# Patient Record
Sex: Female | Born: 1960 | Race: White | Hispanic: No | Marital: Married | Smoking: Never smoker
Health system: Southern US, Community
[De-identification: ages and names within clinical notes are randomized; demographics above are authoritative.]

## PROBLEM LIST (undated history)

## (undated) DIAGNOSIS — E7800 Pure hypercholesterolemia, unspecified: Secondary | ICD-10-CM

## (undated) DIAGNOSIS — I82409 Acute embolism and thrombosis of unspecified deep veins of unspecified lower extremity: Secondary | ICD-10-CM

## (undated) DIAGNOSIS — I10 Essential (primary) hypertension: Secondary | ICD-10-CM

## (undated) DIAGNOSIS — M47817 Spondylosis without myelopathy or radiculopathy, lumbosacral region: Secondary | ICD-10-CM

## (undated) DIAGNOSIS — E119 Type 2 diabetes mellitus without complications: Secondary | ICD-10-CM

## (undated) DIAGNOSIS — K5190 Ulcerative colitis, unspecified, without complications: Secondary | ICD-10-CM

## (undated) DIAGNOSIS — C679 Malignant neoplasm of bladder, unspecified: Secondary | ICD-10-CM

## (undated) DIAGNOSIS — N8110 Cystocele, unspecified: Secondary | ICD-10-CM

## (undated) DIAGNOSIS — I2699 Other pulmonary embolism without acute cor pulmonale: Secondary | ICD-10-CM

## (undated) HISTORY — DX: Malignant neoplasm of bladder, unspecified: C67.9

## (undated) HISTORY — DX: Essential (primary) hypertension: I10

## (undated) HISTORY — PX: BREAST LUMPECTOMY: SHX2

## (undated) HISTORY — DX: Cystocele, unspecified: N81.10

## (undated) HISTORY — PX: TRANSURETHRAL RESECTION OF BLADDER TUMOR WITH GYRUS (TURBT-GYRUS): SHX6458

## (undated) HISTORY — DX: Other pulmonary embolism without acute cor pulmonale: I26.99

## (undated) HISTORY — DX: Type 2 diabetes mellitus without complications: E11.9

## (undated) HISTORY — PX: CYSTECTOMY W/ URETEROILEAL CONDUIT: SUR361

## (undated) HISTORY — PX: BREAST BIOPSY: SHX20

## (undated) HISTORY — PX: CHOLECYSTECTOMY: SHX55

## (undated) HISTORY — DX: Spondylosis without myelopathy or radiculopathy, lumbosacral region: M47.817

## (undated) HISTORY — DX: Acute embolism and thrombosis of unspecified deep veins of unspecified lower extremity: I82.409

## (undated) HISTORY — DX: Ulcerative colitis, unspecified, without complications: K51.90

## (undated) HISTORY — DX: Pure hypercholesterolemia, unspecified: E78.00

---

## 2002-09-24 ENCOUNTER — Ambulatory Visit (HOSPITAL_COMMUNITY): Admission: RE | Admit: 2002-09-24 | Discharge: 2002-09-24 | Payer: Self-pay | Admitting: Radiology

## 2002-10-04 ENCOUNTER — Encounter: Admission: RE | Admit: 2002-10-04 | Discharge: 2002-10-04 | Payer: Self-pay | Admitting: Radiology

## 2003-04-04 ENCOUNTER — Encounter: Admission: RE | Admit: 2003-04-04 | Discharge: 2003-04-04 | Payer: Self-pay | Admitting: Radiology

## 2020-08-29 ENCOUNTER — Other Ambulatory Visit: Payer: Self-pay | Admitting: Hematology and Oncology

## 2020-08-29 DIAGNOSIS — Z86718 Personal history of other venous thrombosis and embolism: Secondary | ICD-10-CM

## 2020-09-02 ENCOUNTER — Other Ambulatory Visit: Payer: Self-pay

## 2020-09-02 ENCOUNTER — Ambulatory Visit
Admission: RE | Admit: 2020-09-02 | Discharge: 2020-09-02 | Disposition: A | Discharge status: Home / Self Care | Payer: BC Managed Care – PPO | Source: Ambulatory Visit | Attending: Hematology and Oncology | Admitting: Hematology and Oncology

## 2020-09-02 ENCOUNTER — Encounter: Payer: Self-pay | Admitting: *Deleted

## 2020-09-02 DIAGNOSIS — Z86718 Personal history of other venous thrombosis and embolism: Secondary | ICD-10-CM

## 2020-09-02 HISTORY — PX: IR RADIOLOGIST EVAL & MGMT: IMG5224

## 2020-09-02 NOTE — Consult Note (Signed)
Chief Complaint:  Preoperative IVC filter assessment and placement  Referring Physician(s): Huff,Jason D.  History of Present Illness: Lauren Graves is a 59 y.o. 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  She is to undergo complete restaging evaluation with cystoscopy, bladder biopsy/TURBT at [facility] with Dr. Nevada Crane.  On her recent staging CT imaging, acute pulmonary emboli were demonstrated.  She also had CT and ultrasound imaging confirming left lower extremity acute DVT.  For anticoagulation she was placed on Eliquis.  For the surgical procedure she is to discontinue the Eliquis on on 09/03/2020 7 days prior to the scheduled surgical procedure at [facility] next Wednesday, 09/10/2020.  While she is off the Eliquis, she will need placement of a retrievable IVC filter for the perioperative phase and recovery phase.  This will be a retrievable filter.  Today's discussion is regarding the filter placement.  This is a telephone health visit today because of Covid pandemic and to expedite the procedure.  No past medical history on file.    Allergies: Patient has no allergy information on record.  Medications: Prior to Admission medications   Not on File     No family history on file.  Social History   Socioeconomic History  . Marital status: Married    Spouse name: Not on file  . Number of children: Not on file  . Years of education: Not on file  . Highest education level: Not on file  Occupational History  . Not on file  Tobacco Use  . Smoking status: Not on file  Substance and Sexual Activity  . Alcohol use: Not on file  . Drug use: Not on file  . Sexual activity: Not on file  Other Topics Concern  . Not on file  Social History Narrative  . Not on file   Social Determinants of Health   Financial Resource Strain:   . Difficulty of Paying Living Expenses: Not on file  Food Insecurity:   . Worried About Paediatric nurse in the Last Year: Not on file  . Ran Out of Food in the Last Year: Not on file  Transportation Needs:   . Lack of Transportation (Medical): Not on file  . Lack of Transportation (Non-Medical): Not on file  Physical Activity:   . Days of Exercise per Week: Not on file  . Minutes of Exercise per Session: Not on file  Stress:   . Feeling of Stress : Not on file  Social Connections:   . Frequency of Communication with Friends and Family: Not on file  . Frequency of Social Gatherings with Friends and Family: Not on file  . Attends Religious Services: Not on file  . Active Member of Clubs or Organizations: Not on file  . Attends Archivist Meetings: Not on file  . Marital Status: Not on file     Review of Systems  Review of Systems: A 12 point ROS discussed and pertinent positives are indicated in the HPI above.  All other systems are negative.  Physical Exam No direct physical exam was performed, telephone health visit only today because of Covid pandemic Vital Signs: There were no vitals taken for this visit.  Imaging: No results found.  Labs:  CBC: No results for input(s): WBC, HGB, HCT, PLT in the last 8760 hours.  COAGS: No results for input(s): INR, APTT in the last 8760 hours.  BMP: No results for  input(s): NA, K, CL, CO2, GLUCOSE, BUN, CALCIUM, CREATININE, GFRNONAA, GFRAA in the last 8760 hours.  Invalid input(s): CMP  LIVER FUNCTION TESTS: No results for input(s): BILITOT, AST, ALT, ALKPHOS, PROT, ALBUMIN in the last 8760 hours.  TUMOR MARKERS: No results for input(s): AFPTM, CEA, CA199, CHROMGRNA in the last 8760 hours.  Assessment and Plan:  Clinical stage III urothelial cancer of the bladder.  She is scheduled for complete restaging evaluation with cystoscopy, bladder biopsy/TURBT with Dr. Nevada Crane at [facility].  She will need a preoperative filter given the history of recent acute DVT/PE.  She is already been instructed to stop  her Eliquis 7 days prior to the surgery.  Our discussion today centered around placement of a retrievable Bard Denali IVC filter.  The procedure, risk, benefits and alternatives were reviewed.  The risk of IVC filter placement were reviewed, including malposition, filter tilt, filter migration, and filter thrombosis.  She understands the procedure and all questions were addressed.  She would like to schedule the procedure prior to her surgery next Wednesday.  Plan: Scheduled for outpatient IVC filter insertion at [facility] preferably next Tuesday, 09/09/2020 as I am scheduled at [facility] that day.  Surgery is scheduled the following day on Wednesday, 09/10/2020.  Thank you for this interesting consult.  I greatly enjoyed meeting JLEIGH STRIPLIN and look forward to participating in their care.  A copy of this report was sent to the requesting provider on this date.  Electronically Signed: Greggory Keen 09/02/2020, 11:06 AM   I spent a total of  40 Minutes   in remote  clinical consultation, greater than 50% of which was counseling/coordinating care for this patient with acute DVT/PE.    Visit type: Audio only (telephone). Audio (no video) only due to patient's lack of internet/smartphone capability. Alternative for in-person consultation at [facility], [address] [place], [place], [place]. This visit type was conducted due to national recommendations for restrictions regarding the COVID-19 Pandemic (e.g. social distancing).  This format is felt to be most appropriate for this patient at this time.  All issues noted in this document were discussed and addressed.

## 2020-11-11 ENCOUNTER — Other Ambulatory Visit: Payer: Self-pay | Admitting: Interventional Radiology

## 2020-11-11 DIAGNOSIS — Z86718 Personal history of other venous thrombosis and embolism: Secondary | ICD-10-CM

## 2020-12-03 ENCOUNTER — Ambulatory Visit
Admission: RE | Admit: 2020-12-03 | Discharge: 2020-12-03 | Disposition: A | Discharge status: Home / Self Care | Payer: BC Managed Care – PPO | Source: Ambulatory Visit | Attending: Interventional Radiology | Admitting: Interventional Radiology

## 2020-12-03 ENCOUNTER — Encounter: Payer: Self-pay | Admitting: *Deleted

## 2020-12-03 DIAGNOSIS — Z86718 Personal history of other venous thrombosis and embolism: Secondary | ICD-10-CM

## 2020-12-03 HISTORY — PX: IR RADIOLOGIST EVAL & MGMT: IMG5224

## 2020-12-03 NOTE — Progress Notes (Signed)
Patient ID: Lauren Graves, female   DOB: [birth year], 60 y.o.   MRN: [id number]       Chief Complaint:  IVC filter management  Referring Physician(s): Dr. Harlow Asa  History of Present Illness: Lauren Graves is a 60 y.o. female with a prior history of bladder cancer status post cystectomy with ileal conduit diversion in late October.   Patient a prior history of PE/DVT and had a perioperative IVC filter placed temporarily.    She is recovering very well as an outpatient.  She is not quite back to her functional baseline but fairly active.  She is not back to work yet.  Overall she is doing very well.  No recent illness or fevers.  No chest pain or shortness of breath.  She remains on Eliquis for the previous DVT and PE event.  She has been on this for 3 months with plans for a total of 6 months of treatment.  Repeat will extremity ultrasound today demonstrates no evidence of DVT in either extremity.  No past medical history on file.    Allergies: Patient has no allergy information on record.  Medications: Prior to Admission medications   Not on File     No family history on file.  Social History   Socioeconomic History  . Marital status: Married    Spouse name: Not on file  . Number of children: Not on file  . Years of education: Not on file  . Highest education level: Not on file  Occupational History  . Not on file  Tobacco Use  . Smoking status: Not on file  . Smokeless tobacco: Not on file  Substance and Sexual Activity  . Alcohol use: Not on file  . Drug use: Not on file  . Sexual activity: Not on file  Other Topics Concern  . Not on file  Social History Narrative  . Not on file   Social Determinants of Health   Financial Resource Strain: Not on file  Food Insecurity: Not on file  Transportation Needs: Not on file  Physical Activity: Not on file  Stress: Not on file  Social Connections: Not on file    ECOG Status: 2 - Symptomatic, <50% confined to  bed  Review of Systems: A 12 point ROS discussed and pertinent positives are indicated in the HPI above.  All other systems are negative.  Review of Systems  Vital Signs: There were no vitals taken for this visit.  Physical Exam Constitutional:      General: She is not in acute distress.    Appearance: She is not toxic-appearing.  Eyes:     General: No scleral icterus.    Conjunctiva/sclera: Conjunctivae normal.  Cardiovascular:     Rate and Rhythm: Normal rate and regular rhythm.     Heart sounds: Normal heart sounds. No murmur heard.   Pulmonary:     Effort: Pulmonary effort is normal.     Breath sounds: Normal breath sounds.  Abdominal:     General: Bowel sounds are normal. There is no distension.     Comments: Urostomy site in the abdomen noted.  Postop changes of the abdominal wall.  Musculoskeletal:        General: No swelling or tenderness.  Skin:    Coloration: Skin is not jaundiced.  Neurological:     General: No focal deficit present.     Mental Status: Mental status is at baseline.  Psychiatric:        Mood and  Affect: Mood normal.       Imaging: US Venous Img Lower Bilateral (DVT)  Result Date: 12/03/2020 CLINICAL DATA:  Preoperative IVC filter in October 2021. History of DVT. Assess for residual DVT prior to filter removal. EXAM: BILATERAL LOWER EXTREMITY VENOUS DOPPLER ULTRASOUND TECHNIQUE: Gray-scale sonography with graded compression, as well as color Doppler and duplex ultrasound were performed to evaluate the lower extremity deep venous systems from the level of the common femoral vein and including the common femoral, femoral, profunda femoral, popliteal and calf veins including the posterior tibial, peroneal and gastrocnemius veins when visible. The superficial great saphenous vein was also interrogated. Spectral Doppler was utilized to evaluate flow at rest and with distal augmentation maneuvers in the common femoral, femoral and popliteal veins.  COMPARISON:  None. FINDINGS: RIGHT LOWER EXTREMITY Common Femoral Vein: No evidence of thrombus. Normal compressibility, respiratory phasicity and response to augmentation. Saphenofemoral Junction: No evidence of thrombus. Normal compressibility and flow on color Doppler imaging. Profunda Femoral Vein: No evidence of thrombus. Normal compressibility and flow on color Doppler imaging. Femoral Vein: No evidence of thrombus. Normal compressibility, respiratory phasicity and response to augmentation. Popliteal Vein: No evidence of thrombus. Normal compressibility, respiratory phasicity and response to augmentation. Calf Veins: No evidence of thrombus. Normal compressibility and flow on color Doppler imaging. Superficial Great Saphenous Vein: No evidence of thrombus. Normal compressibility. LEFT LOWER EXTREMITY Common Femoral Vein: No evidence of thrombus. Normal compressibility, respiratory phasicity and response to augmentation. Saphenofemoral Junction: No evidence of thrombus. Normal compressibility and flow on color Doppler imaging. Profunda Femoral Vein: No evidence of thrombus. Normal compressibility and flow on color Doppler imaging. Femoral Vein: No evidence of thrombus. Normal compressibility, respiratory phasicity and response to augmentation. Popliteal Vein: No evidence of thrombus. Normal compressibility, respiratory phasicity and response to augmentation. Calf Veins: No evidence of thrombus. Normal compressibility and flow on color Doppler imaging. Superficial Great Saphenous Vein: No evidence of thrombus. Normal compressibility. IMPRESSION: No evidence of deep venous thrombosis in either lower extremity. Electronically Signed   By: Jerilynn Mages.  Adeana Grilliot M.D.   On: 12/03/2020 11:56   IR Radiologist Eval & Mgmt  Result Date: 12/03/2020 Please refer to notes tab for details about interventional procedure. (Op Note)   Labs:  CBC: No results for input(s): WBC, HGB, HCT, PLT in the last 8760 hours.  COAGS: No  results for input(s): INR, APTT in the last 8760 hours.  BMP: No results for input(s): NA, K, CL, CO2, GLUCOSE, BUN, CALCIUM, CREATININE, GFRNONAA, GFRAA in the last 8760 hours.  Invalid input(s): CMP  LIVER FUNCTION TESTS: No results for input(s): BILITOT, AST, ALT, ALKPHOS, PROT, ALBUMIN in the last 8760 hours.  TUMOR MARKERS: No results for input(s): AFPTM, CEA, CA199, CHROMGRNA in the last 8760 hours.  Assessment and Plan:  History of previous acute DVT and PE.  Patient had a IVC filter placed perioperatively for bladder surgery which was performed in late October.  She is recovering very well as an outpatient.  She is closely followed by urology and oncology in [facility].  Overall she is doing very well at this point.  Repeat ultrasound the lower extremities demonstrates no evidence of DVT.  At this point, she can have the filter removed.  This was reviewed with the patient.  All questions addressed.  Plan: She is scheduled to see Dr. Harlow Asa in early March for her oncology follow-up.  As long as he is in agreement at that time, we plan to electively remove the filter  sometime in March or April.  This will be performed in [facility].  Thank you for this interesting consult.  I greatly enjoyed meeting DASHONNA ZAMBELLI and look forward to participating in their care.  A copy of this report was sent to the requesting provider on this date.  Electronically Signed: Greggory Keen 12/03/2020, 12:04 PM   I spent a total of    25 Minutes in face to face in clinical consultation, greater than 50% of which was counseling/coordinating care for this patient with an IVC retrievable filter.

## 2021-02-03 ENCOUNTER — Other Ambulatory Visit: Payer: Self-pay | Admitting: Hematology and Oncology

## 2021-02-03 DIAGNOSIS — Z95828 Presence of other vascular implants and grafts: Secondary | ICD-10-CM

## 2021-08-24 IMAGING — US US EXTREM LOW VENOUS
1 series · 13 of 24 positions shown · non-contrast
Comparison: None.

CLINICAL DATA: Preoperative IVC filter in August 2020. History of
DVT. Assess for residual DVT prior to filter removal.



[Series 1: us extrem low venous · 0.07mm/px · 13 of 63 slices shown]
[im 1/63]
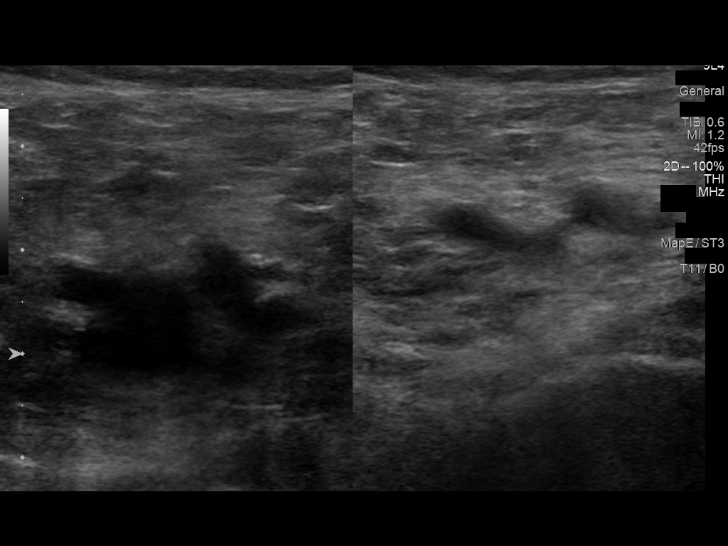
[im 6/63]
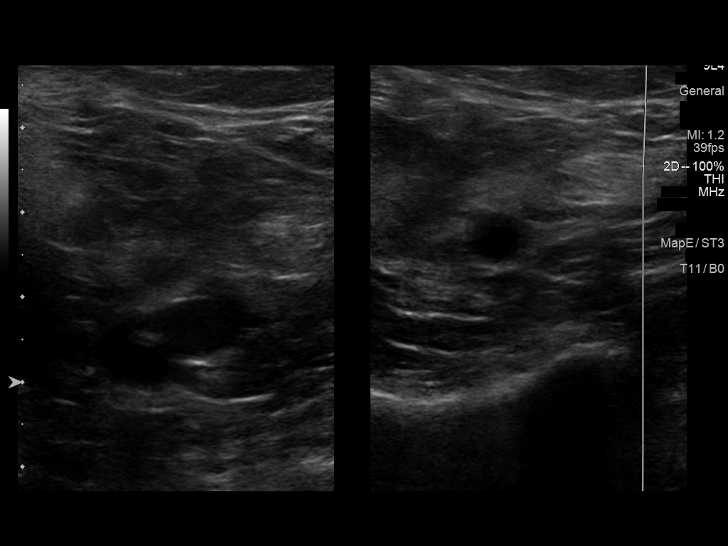
[im 11/63]
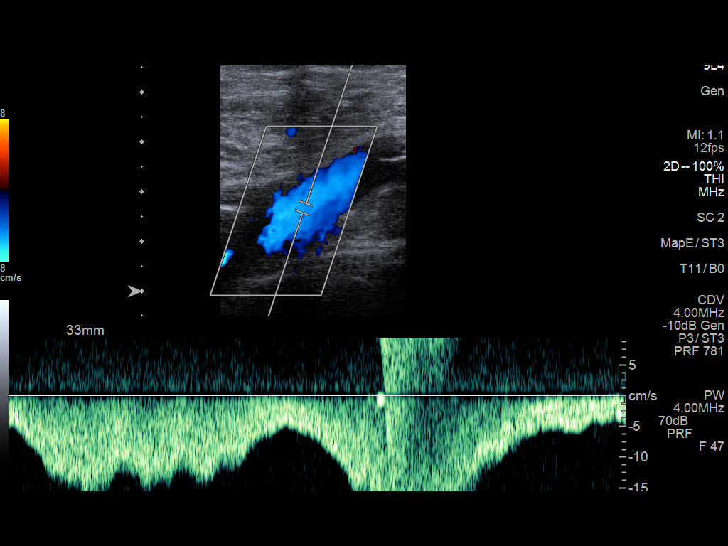
[im 17/63]
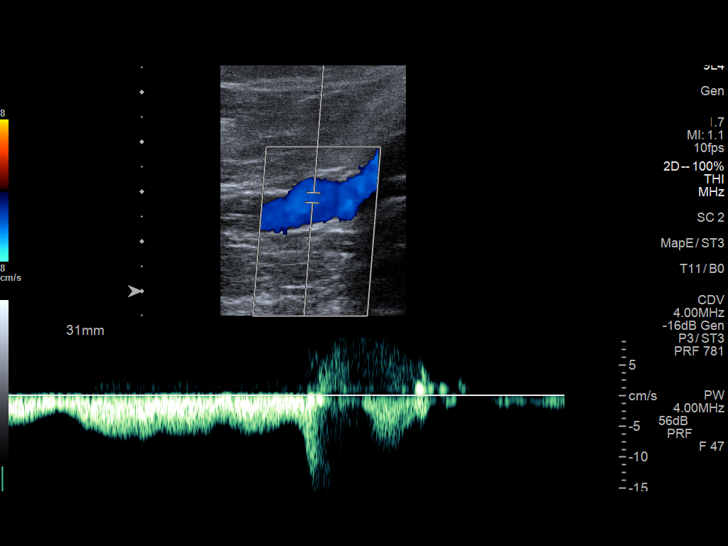
[im 22/63]
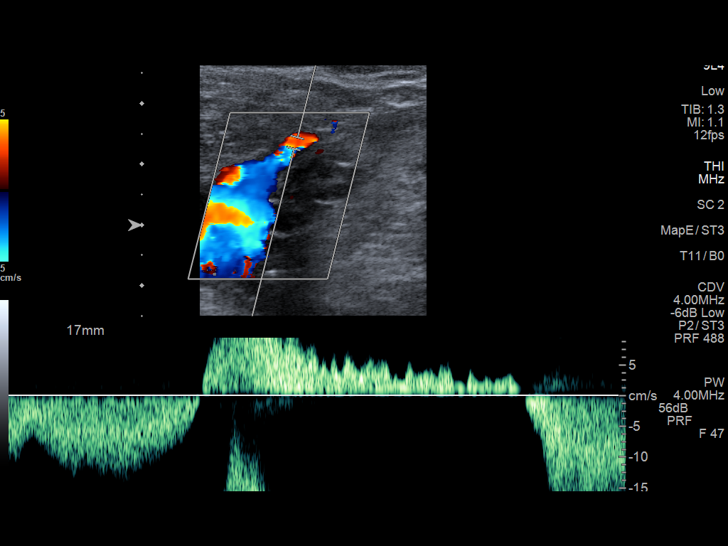
[im 27/63]
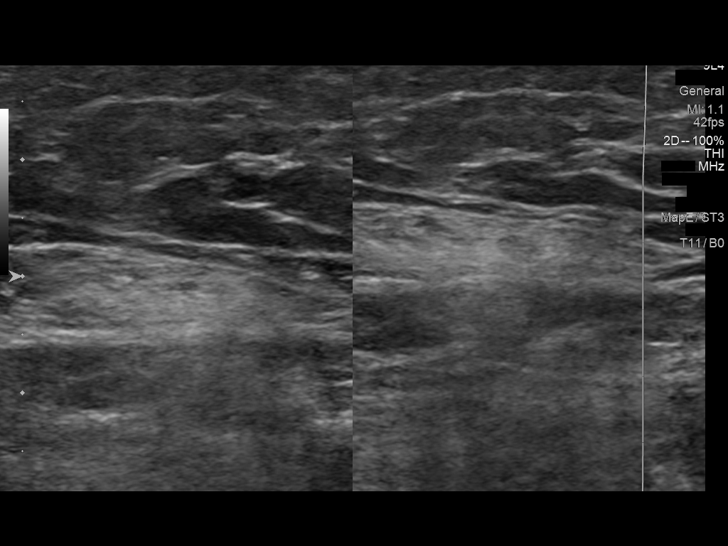
[im 33/63]
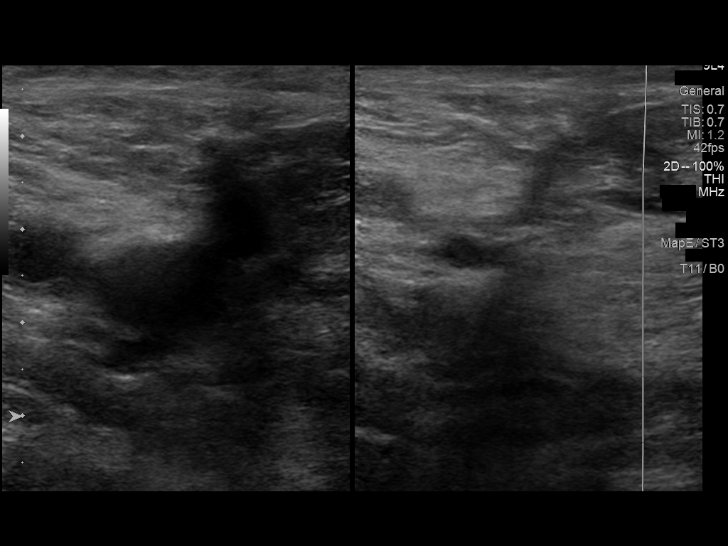
[im 36/63]
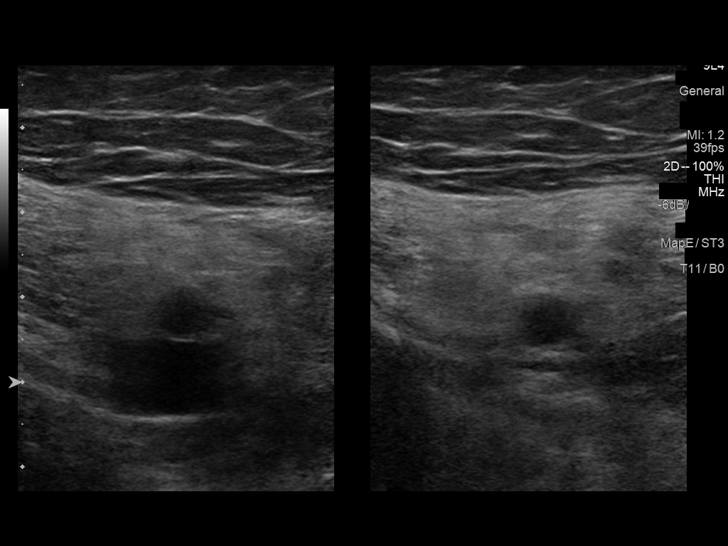
[im 41/63]
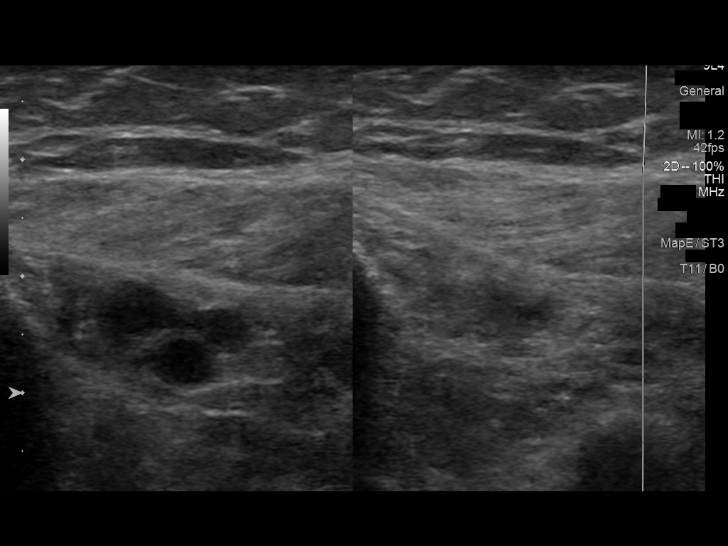
[im 46/63]
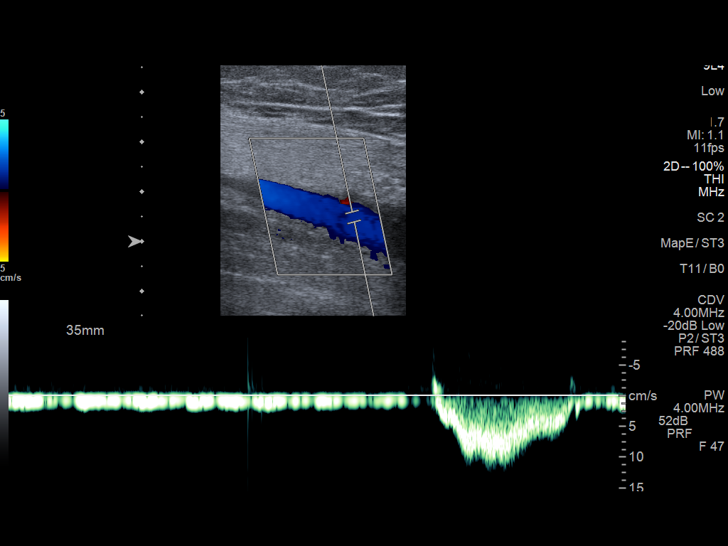
[im 52/63]
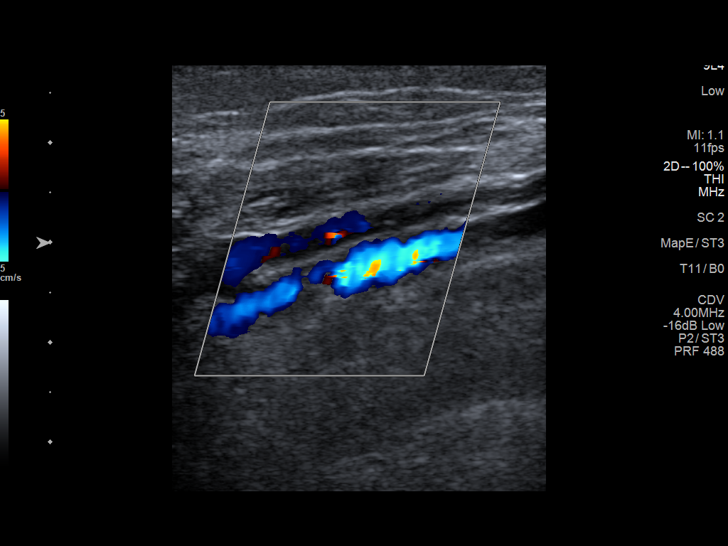
[im 57/63]
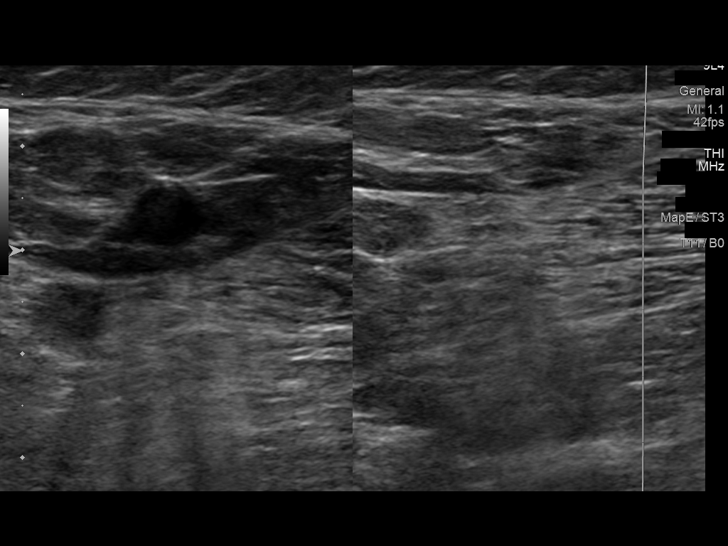
[im 63/63]
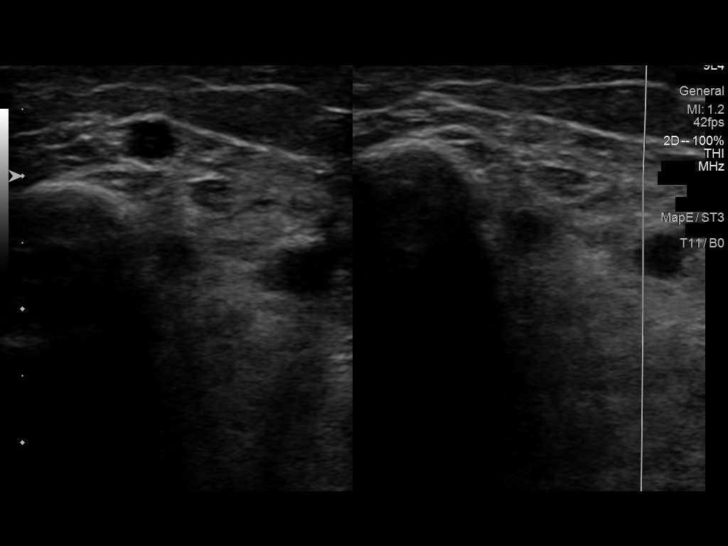

[13 of 24 positions shown; findings below may reference images not displayed]

FINDINGS: RIGHT LOWER EXTREMITY

Common Femoral Vein: No evidence of thrombus. Normal
compressibility, respiratory phasicity and response to augmentation.

Saphenofemoral Junction: No evidence of thrombus. Normal
compressibility and flow on color Doppler imaging.

Profunda Femoral Vein: No evidence of thrombus. Normal
compressibility and flow on color Doppler imaging.

Femoral Vein: No evidence of thrombus. Normal compressibility,
respiratory phasicity and response to augmentation.

Popliteal Vein: No evidence of thrombus. Normal compressibility,
respiratory phasicity and response to augmentation.

Calf Veins: No evidence of thrombus. Normal compressibility and flow
on color Doppler imaging.

Superficial Great Saphenous Vein: No evidence of thrombus. Normal
compressibility.

LEFT LOWER EXTREMITY

Common Femoral Vein: No evidence of thrombus. Normal
compressibility, respiratory phasicity and response to augmentation.

Saphenofemoral Junction: No evidence of thrombus. Normal
compressibility and flow on color Doppler imaging.

Profunda Femoral Vein: No evidence of thrombus. Normal
compressibility and flow on color Doppler imaging.

Femoral Vein: No evidence of thrombus. Normal compressibility,
respiratory phasicity and response to augmentation.

Popliteal Vein: No evidence of thrombus. Normal compressibility,
respiratory phasicity and response to augmentation.

Calf Veins: No evidence of thrombus. Normal compressibility and flow
on color Doppler imaging.

Superficial Great Saphenous Vein: No evidence of thrombus. Normal
compressibility.
IMPRESSION: No evidence of deep venous thrombosis in either lower extremity.

## 2022-03-10 ENCOUNTER — Ambulatory Visit: Payer: BC Managed Care – PPO | Admitting: Urology

## 2022-03-10 ENCOUNTER — Encounter: Payer: Self-pay | Admitting: Urology

## 2022-03-10 VITALS — BP 105/68 | HR 75 | Ht 65.0 in | Wt 170.0 lb

## 2022-03-10 DIAGNOSIS — R8290 Unspecified abnormal findings in urine: Secondary | ICD-10-CM

## 2022-03-10 DIAGNOSIS — Z8551 Personal history of malignant neoplasm of bladder: Secondary | ICD-10-CM

## 2022-03-10 DIAGNOSIS — C678 Malignant neoplasm of overlapping sites of bladder: Secondary | ICD-10-CM | POA: Insufficient documentation

## 2022-03-10 NOTE — Progress Notes (Signed)
? ?Assessment: ?1. Malignant neoplasm of overlapping sites of bladder [facility]); status post cystectomy with ileal conduit 11/21   ?2. Abnormal urine findings   ? ? ?Plan: ?I reviewed her records from [place] including prior urology notes, oncology notes and urogynecology notes.  I also reviewed her recent imaging studies and lab results. ?Urine culture with [facility] in [place] ?Return to office in 6 months ? ?Chief Complaint:  ?Chief Complaint  ?Patient presents with  ? Bladder Cancer  ? ? ?History of Present Illness: ? ?Lauren Graves is a 61 y.o. year old female who is seen for continued evaluation of bladder cancer.  She was previously followed at [facility] with her last visit in February 2022. ?She returns today for follow-up.  She overall feels well.  No weight loss.  She has a good appetite.  Her bowels are moving well.  She was treated for a UTI due to foul-smelling urine and back pain.   ?Urine culture from 02/09/2022 grew >100 K [place] and >100 K [place]. ?She reports that the vaginal discharge is stable.  She does have occasional discomfort around her stoma.  No problems with her appliance. ? ?Urologic History: ?She was diagnosed with clinical stage T3 urothelial carcinoma of the bladder. She received MVAC chemotherapy. She has a history of a pulmonary embolism  managed with an IVC filter as well as anticoagulation. She is status post an anterior pelvic exenteration with ileal conduit urinary diversion in November 2021. 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 ?She has done well postoperatively. Her ureteral stents were removed on 10/20/2020. ?CMP from 10/23/2020 showed a normal creatinine of 0.76 and normal LFTs. ?Renal ultrasound from 11/13/2020 showed no evidence of hydronephrosis. ? ?She has been followed by Dr. Harlow Asa in oncology.  Her last visit with Dr. Harlow Asa was  in March 2023.  CT of the chest, abdomen, and pelvis from 02/07/2022 showed no evidence of recurrent or metastatic disease in the chest, abdomen, or pelvis. ? ?She has had problems with vaginal discharge and has been evaluated by urogynecology.  She was started on vaginal estrogen cream, vaginal flushes and sitz bath's.  At her last visit in March 2023 she had noted improvement in her symptoms. ? ? ?Past Medical History:  ?Past Medical History:  ?Diagnosis Date  ? Bladder cancer ([place])   ? Diabetes ([place])   ? DJD (degenerative joint disease), lumbosacral   ? DVT (deep venous thrombosis) ([place])   ? Hypercholesterolemia   ? Hypertension   ? Pulmonary emboli ([place])   ? Ulcerative colitis ([place])   ? Vaginal prolapse   ? ? ?Past Surgical History:  ?Past Surgical History:  ?Procedure Laterality Date  ? BREAST BIOPSY    ? BREAST LUMPECTOMY    ? CHOLECYSTECTOMY    ? CYSTECTOMY W/ URETEROILEAL CONDUIT    ? IR RADIOLOGIST EVAL & MGMT  09/02/2020  ? IR RADIOLOGIST EVAL & MGMT  12/03/2020  ? TRANSURETHRAL RESECTION OF BLADDER TUMOR WITH GYRUS (TURBT-GYRUS)    ? ? ?Allergies:  ?Allergies  ?Allergen Reactions  ? Sulfa Antibiotics Hives, Rash, Shortness Of Breath and Swelling  ? Tape Other (See Comments)  ?  Takes skin off   ? ? ?Family History:  ?History reviewed. No pertinent family history. ? ?Social History:  ?Social History  ? ?Tobacco Use  ? Smoking status: Never  ? Smokeless tobacco: Never  ?Substance Use Topics  ?  Alcohol use: Never  ? ? ?Review of symptoms:  ?Constitutional:  Negative for unexplained weight loss, night sweats, fever, chills ?ENT:  Negative for nose bleeds, sinus pain, painful swallowing ?CV:  Negative for chest pain, shortness of breath, exercise intolerance, palpitations, loss of consciousness ?Resp:  Negative for cough, wheezing, shortness of breath ?GI:  Negative for nausea, vomiting, diarrhea, bloody stools ?GU:  Positives noted in HPI; otherwise negative for gross hematuria, dysuria, urinary  incontinence ?Neuro:  Negative for seizures, poor balance, limb weakness, slurred speech ?Psych:  Negative for lack of energy, depression, anxiety ?Endocrine:  Negative for polydipsia, polyuria, symptoms of hypoglycemia (dizziness, hunger, sweating) ?Hematologic:  Negative for anemia, purpura, petechia, prolonged or excessive bleeding, use of anticoagulants  ?Allergic:  Negative for difficulty breathing or choking as a result of exposure to anything; no shellfish allergy; no allergic response (rash/itch) to materials, foods ? ?Physical exam: ?BP 105/68   Pulse 75   Ht '5\' 5"'$  (1.651 m)   Wt 170 lb (77.1 kg)   BMI 28.29 kg/m?  ?GENERAL APPEARANCE:  Well appearing, well developed, well nourished, NAD ?HEENT:  Atraumatic, normocephalic, oropharynx clear ?NECK:  Supple without lymphadenopathy or thyromegaly ?ABDOMEN:  Soft, non-tender, no masses.  No obvious parastomal hernia.  Stoma pink and healthy. ?EXTREMITIES:  Moves all extremities well, without clubbing, cyanosis, or edema ?NEUROLOGIC:  Alert and oriented x 3, normal gait, CN II-XII grossly intact ?MENTAL STATUS:  appropriate ?BACK:  Non-tender to palpation, No CVAT ?SKIN:  Warm, dry, and intact ? ?Results: ?None ? ?

## 2022-03-15 ENCOUNTER — Ambulatory Visit: Payer: BC Managed Care – PPO | Admitting: Urology

## 2022-09-07 ENCOUNTER — Encounter: Payer: Self-pay | Admitting: Urology

## 2022-09-07 ENCOUNTER — Ambulatory Visit (INDEPENDENT_AMBULATORY_CARE_PROVIDER_SITE_OTHER): Payer: BC Managed Care – PPO | Admitting: Urology

## 2022-09-07 VITALS — BP 117/70 | HR 75

## 2022-09-07 DIAGNOSIS — C678 Malignant neoplasm of overlapping sites of bladder: Secondary | ICD-10-CM | POA: Diagnosis not present

## 2022-09-07 NOTE — Progress Notes (Signed)
Assessment: 1. Malignant neoplasm of overlapping sites of bladder [facility]); status post cystectomy with ileal conduit 11/21     Plan: I personally reviewed the patient's records from [facility] including provider notes, lab results, and imaging results. Urine cytology sent today. Return to office in 6 months  Chief Complaint:  Chief Complaint  Patient presents with   Bladder Cancer    History of Present Illness:  Lauren Graves is a 61 y.o. year old female who is seen for continued evaluation of bladder cancer.  She was previously followed at [facility] with her last visit there in February 2022. She was last seen here in April 2023.  At that time she felt well.  She had a good appetite with no recent weight loss.  She had been treated for a UTI due to foul-smelling urine and back pain.   Urine culture from 02/09/2022 grew >100 K Morganella and >100 K Providencia. Her vaginal discharge was stable.  She reported occasional discomfort around her stoma.  No problems with her appliance.    She returns today for follow-up.  She has recently been hospitalized in September 2023 and again in October 23.  She was diagnosed with Addison's disease and subsequently with a exacerbation of her ulcerative colitis.  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  She has since been restarted on anticoagulation with Eliquis.  She reports that her appetite is slightly decreased.  Her weight has been stable.  She has had good output from her ostomy.  No gross hematuria.  No flank pain.  No problem with her ostomy appliance.  She is having normal bowel movements.  She continues to have vaginal discharge which is unchanged. CT imaging from 07/06/2022 showed no evidence of metastatic disease in the chest, abdomen, or pelvis. CT abdomen and pelvis from 08/24/2022 showed evidence of colitis, no evidence of metastatic disease or adenopathy. CMP from 08/23/2022 showed a creatinine of  0.89, elevated alkaline phosphatase, normal AST and ALT.  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 She received MVAC chemotherapy. She has a history of a pulmonary embolism  managed with an IVC filter as well as anticoagulation. She is status post an anterior pelvic exenteration with ileal conduit urinary diversion in November 2021. 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 She has done well postoperatively. Her ureteral stents were removed on 10/20/2020. CMP from 10/23/2020 showed a normal creatinine of 0.76 and normal LFTs. Renal ultrasound from 11/13/2020 showed no evidence of hydronephrosis.  She has been followed by Dr. Harlow Asa in oncology.    CT of the chest, abdomen, and pelvis from 02/07/2022 showed no evidence of recurrent or metastatic disease in the chest, abdomen, or pelvis.   Her last visit with Dr. Harlow Asa was in October 2023.  She has had problems with vaginal discharge and has been evaluated by urogynecology.  She was started on vaginal estrogen cream, vaginal flushes and sitz bath's.  At her last visit in March 2023 she had noted improvement in her symptoms.  Portions of the above documentation were copied from a prior visit for review purposes only.   Past Medical History:  Past Medical History:  Diagnosis Date   Bladder cancer ([place])    Diabetes ([place])    DJD (degenerative joint disease), lumbosacral    DVT (deep venous thrombosis) ([place])  Hypercholesterolemia    Hypertension    Pulmonary emboli ([facility])    Ulcerative colitis ([place])    Vaginal prolapse     Past Surgical History:  Past Surgical History:  Procedure Laterality Date   BREAST BIOPSY     BREAST LUMPECTOMY     CHOLECYSTECTOMY     CYSTECTOMY W/ URETEROILEAL CONDUIT     IR RADIOLOGIST EVAL & MGMT  09/02/2020   IR RADIOLOGIST EVAL & MGMT   12/03/2020   TRANSURETHRAL RESECTION OF BLADDER TUMOR WITH GYRUS (TURBT-GYRUS)      Allergies:  Allergies  Allergen Reactions   Sulfa Antibiotics Hives, Rash, Shortness Of Breath and Swelling   Tape Other (See Comments)    Takes skin off     Family History:  No family history on file.  Social History:  Social History   Tobacco Use   Smoking status: Never   Smokeless tobacco: Never  Substance Use Topics   Alcohol use: Never    ROS: Constitutional:  Negative for fever, chills, weight loss CV: Negative for chest pain, previous MI, hypertension Respiratory:  Negative for shortness of breath, wheezing, sleep apnea, frequent cough GI:  Negative for nausea, vomiting, bloody stool, GERD  Physical exam: BP 117/70   Pulse 75  GENERAL APPEARANCE:  Well appearing, well developed, well nourished, NAD HEENT:  Atraumatic, normocephalic, oropharynx clear NECK:  Supple without lymphadenopathy or thyromegaly ABDOMEN:  Soft, non-tender, no masses, stoma pink and healthy with clear urine in ostomy bag EXTREMITIES:  Moves all extremities well, without clubbing, cyanosis, or edema NEUROLOGIC:  Alert and oriented x 3, normal gait, CN II-XII grossly intact MENTAL STATUS:  appropriate BACK:  Non-tender to palpation, No CVAT SKIN:  Warm, dry, and intact  Results: U/A: 6-10 WBCs, 0 RBCs, moderate bacteria, nitrite positive

## 2022-09-08 LAB — URINALYSIS, ROUTINE W REFLEX MICROSCOPIC
Bilirubin, UA: NEGATIVE
Glucose, UA: NEGATIVE
Ketones, UA: NEGATIVE
Leukocytes,UA: NEGATIVE
Nitrite, UA: POSITIVE — AB
RBC, UA: NEGATIVE
Specific Gravity, UA: 1.02 (ref 1.005–1.030)
Urobilinogen, Ur: 0.2 mg/dL (ref 0.2–1.0)
pH, UA: 6.5 (ref 5.0–7.5)

## 2022-09-08 LAB — MICROSCOPIC EXAMINATION: RBC, Urine: NONE SEEN /hpf (ref 0–2)

## 2022-09-20 ENCOUNTER — Encounter: Payer: Self-pay | Admitting: Urology

## 2022-11-17 DIAGNOSIS — G20A1 Parkinson's disease without dyskinesia, without mention of fluctuations: Secondary | ICD-10-CM | POA: Diagnosis not present

## 2022-11-23 DIAGNOSIS — Z7901 Long term (current) use of anticoagulants: Secondary | ICD-10-CM | POA: Diagnosis not present

## 2022-11-23 DIAGNOSIS — C678 Malignant neoplasm of overlapping sites of bladder: Secondary | ICD-10-CM | POA: Diagnosis not present

## 2022-11-23 DIAGNOSIS — I2699 Other pulmonary embolism without acute cor pulmonale: Secondary | ICD-10-CM | POA: Diagnosis not present

## 2022-12-01 DIAGNOSIS — Z7409 Other reduced mobility: Secondary | ICD-10-CM | POA: Diagnosis not present

## 2022-12-01 DIAGNOSIS — H26492 Other secondary cataract, left eye: Secondary | ICD-10-CM | POA: Diagnosis not present

## 2022-12-01 DIAGNOSIS — G20A1 Parkinson's disease without dyskinesia, without mention of fluctuations: Secondary | ICD-10-CM | POA: Diagnosis not present

## 2022-12-06 DIAGNOSIS — K51918 Ulcerative colitis, unspecified with other complication: Secondary | ICD-10-CM | POA: Diagnosis not present

## 2022-12-06 DIAGNOSIS — K120 Recurrent oral aphthae: Secondary | ICD-10-CM | POA: Diagnosis not present

## 2022-12-17 DIAGNOSIS — Z7409 Other reduced mobility: Secondary | ICD-10-CM | POA: Diagnosis not present

## 2022-12-17 DIAGNOSIS — G20A1 Parkinson's disease without dyskinesia, without mention of fluctuations: Secondary | ICD-10-CM | POA: Diagnosis not present

## 2022-12-20 DIAGNOSIS — L400 Psoriasis vulgaris: Secondary | ICD-10-CM | POA: Diagnosis not present

## 2022-12-20 DIAGNOSIS — L299 Pruritus, unspecified: Secondary | ICD-10-CM | POA: Diagnosis not present

## 2022-12-20 DIAGNOSIS — L853 Xerosis cutis: Secondary | ICD-10-CM | POA: Diagnosis not present

## 2022-12-31 DIAGNOSIS — Z7409 Other reduced mobility: Secondary | ICD-10-CM | POA: Diagnosis not present

## 2022-12-31 DIAGNOSIS — G20A1 Parkinson's disease without dyskinesia, without mention of fluctuations: Secondary | ICD-10-CM | POA: Diagnosis not present

## 2023-01-06 DIAGNOSIS — G20A1 Parkinson's disease without dyskinesia, without mention of fluctuations: Secondary | ICD-10-CM | POA: Diagnosis not present

## 2023-01-06 DIAGNOSIS — Z7409 Other reduced mobility: Secondary | ICD-10-CM | POA: Diagnosis not present

## 2023-01-21 DIAGNOSIS — G20A1 Parkinson's disease without dyskinesia, without mention of fluctuations: Secondary | ICD-10-CM | POA: Diagnosis not present

## 2023-01-21 DIAGNOSIS — Z7409 Other reduced mobility: Secondary | ICD-10-CM | POA: Diagnosis not present

## 2023-01-26 DIAGNOSIS — G20A1 Parkinson's disease without dyskinesia, without mention of fluctuations: Secondary | ICD-10-CM | POA: Diagnosis not present

## 2023-02-03 DIAGNOSIS — R700 Elevated erythrocyte sedimentation rate: Secondary | ICD-10-CM | POA: Diagnosis not present

## 2023-02-03 DIAGNOSIS — M79643 Pain in unspecified hand: Secondary | ICD-10-CM | POA: Diagnosis not present

## 2023-02-03 DIAGNOSIS — M79673 Pain in unspecified foot: Secondary | ICD-10-CM | POA: Diagnosis not present

## 2023-02-03 DIAGNOSIS — L4050 Arthropathic psoriasis, unspecified: Secondary | ICD-10-CM | POA: Diagnosis not present

## 2023-02-03 DIAGNOSIS — K5190 Ulcerative colitis, unspecified, without complications: Secondary | ICD-10-CM | POA: Diagnosis not present

## 2023-02-03 DIAGNOSIS — M797 Fibromyalgia: Secondary | ICD-10-CM | POA: Diagnosis not present

## 2023-02-03 DIAGNOSIS — L409 Psoriasis, unspecified: Secondary | ICD-10-CM | POA: Diagnosis not present

## 2023-02-03 DIAGNOSIS — Z79899 Other long term (current) drug therapy: Secondary | ICD-10-CM | POA: Diagnosis not present

## 2023-02-03 DIAGNOSIS — M7989 Other specified soft tissue disorders: Secondary | ICD-10-CM | POA: Diagnosis not present

## 2023-02-03 DIAGNOSIS — M810 Age-related osteoporosis without current pathological fracture: Secondary | ICD-10-CM | POA: Diagnosis not present

## 2023-02-03 DIAGNOSIS — M1990 Unspecified osteoarthritis, unspecified site: Secondary | ICD-10-CM | POA: Diagnosis not present

## 2023-02-03 DIAGNOSIS — M2550 Pain in unspecified joint: Secondary | ICD-10-CM | POA: Diagnosis not present

## 2023-02-21 DIAGNOSIS — C679 Malignant neoplasm of bladder, unspecified: Secondary | ICD-10-CM | POA: Diagnosis not present

## 2023-02-21 DIAGNOSIS — C678 Malignant neoplasm of overlapping sites of bladder: Secondary | ICD-10-CM | POA: Diagnosis not present

## 2023-02-21 DIAGNOSIS — J439 Emphysema, unspecified: Secondary | ICD-10-CM | POA: Diagnosis not present

## 2023-02-21 DIAGNOSIS — K862 Cyst of pancreas: Secondary | ICD-10-CM | POA: Diagnosis not present

## 2023-02-22 DIAGNOSIS — Z08 Encounter for follow-up examination after completed treatment for malignant neoplasm: Secondary | ICD-10-CM | POA: Diagnosis not present

## 2023-02-22 DIAGNOSIS — C678 Malignant neoplasm of overlapping sites of bladder: Secondary | ICD-10-CM | POA: Diagnosis not present

## 2023-02-22 DIAGNOSIS — K862 Cyst of pancreas: Secondary | ICD-10-CM | POA: Diagnosis not present

## 2023-02-24 DIAGNOSIS — Z1211 Encounter for screening for malignant neoplasm of colon: Secondary | ICD-10-CM | POA: Diagnosis not present

## 2023-02-24 DIAGNOSIS — K641 Second degree hemorrhoids: Secondary | ICD-10-CM | POA: Diagnosis not present

## 2023-02-24 DIAGNOSIS — K51818 Other ulcerative colitis with other complication: Secondary | ICD-10-CM | POA: Diagnosis not present

## 2023-02-24 DIAGNOSIS — K51918 Ulcerative colitis, unspecified with other complication: Secondary | ICD-10-CM | POA: Diagnosis not present

## 2023-02-24 DIAGNOSIS — K5730 Diverticulosis of large intestine without perforation or abscess without bleeding: Secondary | ICD-10-CM | POA: Diagnosis not present

## 2023-02-24 DIAGNOSIS — K5289 Other specified noninfective gastroenteritis and colitis: Secondary | ICD-10-CM | POA: Diagnosis not present

## 2023-03-01 DIAGNOSIS — N281 Cyst of kidney, acquired: Secondary | ICD-10-CM | POA: Diagnosis not present

## 2023-03-01 DIAGNOSIS — K869 Disease of pancreas, unspecified: Secondary | ICD-10-CM | POA: Diagnosis not present

## 2023-03-01 DIAGNOSIS — K8689 Other specified diseases of pancreas: Secondary | ICD-10-CM | POA: Diagnosis not present

## 2023-03-09 ENCOUNTER — Encounter: Payer: Self-pay | Admitting: Urology

## 2023-03-09 ENCOUNTER — Ambulatory Visit: Payer: 59 | Admitting: Urology

## 2023-03-09 VITALS — BP 100/68 | HR 79 | Ht 65.0 in | Wt 158.8 lb

## 2023-03-09 DIAGNOSIS — Z09 Encounter for follow-up examination after completed treatment for conditions other than malignant neoplasm: Secondary | ICD-10-CM | POA: Diagnosis not present

## 2023-03-09 DIAGNOSIS — Z8551 Personal history of malignant neoplasm of bladder: Secondary | ICD-10-CM

## 2023-03-09 DIAGNOSIS — C678 Malignant neoplasm of overlapping sites of bladder: Secondary | ICD-10-CM | POA: Diagnosis not present

## 2023-03-09 LAB — MICROSCOPIC EXAMINATION
Trichomonas, UA: NONE SEEN
Yeast, UA: NONE SEEN

## 2023-03-09 LAB — URINALYSIS, ROUTINE W REFLEX MICROSCOPIC
Bilirubin, UA: NEGATIVE
Glucose, UA: NEGATIVE
Ketones, UA: NEGATIVE
Leukocytes,UA: NEGATIVE
Nitrite, UA: POSITIVE — AB
RBC, UA: NEGATIVE
Specific Gravity, UA: 1.015 (ref 1.005–1.030)
Urobilinogen, Ur: 0.2 mg/dL (ref 0.2–1.0)
pH, UA: 8.5 — ABNORMAL HIGH (ref 5.0–7.5)

## 2023-03-09 NOTE — Progress Notes (Signed)
Assessment: 1. Malignant neoplasm of overlapping sites of bladder [facility]); status post cystectomy with ileal conduit 11/21     Plan: I personally reviewed the patient's records from [facility] including provider notes, lab results, and imaging results. Return to office in 6 months  Chief Complaint:  Chief Complaint  Patient presents with   Bladder Cancer    History of Present Illness:  Lauren Graves is a 62 y.o. year old female who is seen for continued evaluation of bladder cancer.  She was previously followed at [facility] with her last visit there in February 2022. She was last seen here in April 2023.  At that time she felt well.  She had a good appetite with no recent weight loss.  She had been treated for a UTI due to foul-smelling urine and back pain.   Urine culture from 02/09/2022 grew >100 K Morganella and >100 K Providencia. Her vaginal discharge was stable.  She reported occasional discomfort around her stoma.  No problems with her appliance.    She was hospitalized in September 2023 and again in October 23.  She was diagnosed with Addison's disease and subsequently with a exacerbation of her ulcerative colitis.  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  She was restarted on anticoagulation with Eliquis.  She reported that her appetite was slightly decreased.  Her weight had been stable.  She had good output from her ostomy.  No gross hematuria.  No flank pain.  No problem with her ostomy appliance.  She was having normal bowel movements.  She continued to have vaginal discharge which was unchanged. CT imaging from 07/06/2022 showed no evidence of metastatic disease in the chest, abdomen, or pelvis. CT abdomen and pelvis from 08/24/2022 showed evidence of colitis, no evidence of metastatic disease or adenopathy. CMP from 08/23/2022 showed a creatinine of 0.89, elevated alkaline phosphatase, normal AST and ALT. She was last seen by Dr. Ottis Stain  on 02/22/2023.  CT of the chest abdomen and pelvis showed no evidence of recurrent or metastatic bladder cancer.  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 CMP showed a creatinine of 0.89, elevated alkaline phosphatase of 129.  She returns today for follow-up.  She is doing well.  No weight loss.  Her appetite is stable.  No flank pain or gross hematuria.  She has had good urine output.  She continues to have some vaginal drainage which is unchanged.  She continues to use vaginal hormone cream. She is to undergo further evaluation of the cystic pancreatic mass in early May.  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 She received MVAC chemotherapy. She has a history of a pulmonary embolism  managed with an IVC filter as well as anticoagulation. She is status post an anterior pelvic exenteration with ileal conduit urinary diversion in November 2021. 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 She has done well postoperatively. Her ureteral stents were removed on 10/20/2020. CMP from 10/23/2020 showed a normal creatinine of 0.76 and normal LFTs. Renal ultrasound from 11/13/2020 showed no evidence of hydronephrosis.  She has been followed by Dr. Ottis Stain in oncology.    CT of the chest, abdomen, and pelvis from 02/07/2022 showed no evidence of recurrent or metastatic disease in the chest, abdomen, or pelvis.     She has had problems  with vaginal discharge and has been evaluated by urogynecology.  She was started on vaginal estrogen cream, vaginal flushes and sitz bath's.  At her last visit in March 2023, she had noted improvement in her symptoms.   Portions of the above documentation were copied from a prior visit for review purposes only.   Past Medical History:  Past Medical History:  Diagnosis Date    Bladder cancer    Diabetes    DJD (degenerative joint disease), lumbosacral    DVT (deep venous thrombosis)    Hypercholesterolemia    Hypertension    Pulmonary emboli    Ulcerative colitis    Vaginal prolapse     Past Surgical History:  Past Surgical History:  Procedure Laterality Date   BREAST BIOPSY     BREAST LUMPECTOMY     CHOLECYSTECTOMY     CYSTECTOMY W/ URETEROILEAL CONDUIT     IR RADIOLOGIST EVAL & MGMT  09/02/2020   IR RADIOLOGIST EVAL & MGMT  12/03/2020   TRANSURETHRAL RESECTION OF BLADDER TUMOR WITH GYRUS (TURBT-GYRUS)      Allergies:  Allergies  Allergen Reactions   Sulfa Antibiotics Hives, Rash, Shortness Of Breath and Swelling   Tape Other (See Comments)    Takes skin off     Family History:  No family history on file.  Social History:  Social History   Tobacco Use   Smoking status: Never   Smokeless tobacco: Never  Substance Use Topics   Alcohol use: Never    ROS: Constitutional:  Negative for fever, chills, weight loss CV: Negative for chest pain, previous MI, hypertension Respiratory:  Negative for shortness of breath, wheezing, sleep apnea, frequent cough GI:  Negative for nausea, vomiting, bloody stool, GERD  Physical exam: BP 100/68   Pulse 79   Ht  (1.651 m)   Wt 158 lb 12.8 oz (72 kg)   BMI 26.43 kg/m  GENERAL APPEARANCE:  Well appearing, well developed, well nourished, NAD HEENT:  Atraumatic, normocephalic, oropharynx clear NECK:  Supple without lymphadenopathy or thyromegaly ABDOMEN:  Soft, non-tender, no masses EXTREMITIES:  Moves all extremities well, without clubbing, cyanosis, or edema NEUROLOGIC:  Alert and oriented x 3, normal gait, CN II-XII grossly intact MENTAL STATUS:  appropriate BACK:  Non-tender to palpation, No CVAT SKIN:  Warm, dry, and intact  Results: U/A: 6-10 WBC, many bacteria, nitrite +

## 2023-03-22 DIAGNOSIS — R935 Abnormal findings on diagnostic imaging of other abdominal regions, including retroperitoneum: Secondary | ICD-10-CM | POA: Diagnosis not present

## 2023-03-22 DIAGNOSIS — K862 Cyst of pancreas: Secondary | ICD-10-CM | POA: Diagnosis not present

## 2023-03-25 DIAGNOSIS — M810 Age-related osteoporosis without current pathological fracture: Secondary | ICD-10-CM | POA: Diagnosis not present

## 2023-04-12 DIAGNOSIS — K862 Cyst of pancreas: Secondary | ICD-10-CM | POA: Diagnosis not present

## 2023-04-12 DIAGNOSIS — C678 Malignant neoplasm of overlapping sites of bladder: Secondary | ICD-10-CM | POA: Diagnosis not present

## 2023-04-12 DIAGNOSIS — D490 Neoplasm of unspecified behavior of digestive system: Secondary | ICD-10-CM | POA: Diagnosis not present

## 2023-04-14 DIAGNOSIS — Z936 Other artificial openings of urinary tract status: Secondary | ICD-10-CM | POA: Diagnosis not present

## 2023-04-14 DIAGNOSIS — Z86718 Personal history of other venous thrombosis and embolism: Secondary | ICD-10-CM | POA: Diagnosis not present

## 2023-04-14 DIAGNOSIS — M810 Age-related osteoporosis without current pathological fracture: Secondary | ICD-10-CM | POA: Diagnosis not present

## 2023-04-14 DIAGNOSIS — Z008 Encounter for other general examination: Secondary | ICD-10-CM | POA: Diagnosis not present

## 2023-04-14 DIAGNOSIS — Z961 Presence of intraocular lens: Secondary | ICD-10-CM | POA: Diagnosis not present

## 2023-04-14 DIAGNOSIS — I10 Essential (primary) hypertension: Secondary | ICD-10-CM | POA: Diagnosis not present

## 2023-04-14 DIAGNOSIS — C679 Malignant neoplasm of bladder, unspecified: Secondary | ICD-10-CM | POA: Diagnosis not present

## 2023-04-14 DIAGNOSIS — M1990 Unspecified osteoarthritis, unspecified site: Secondary | ICD-10-CM | POA: Diagnosis not present

## 2023-04-14 DIAGNOSIS — L4050 Arthropathic psoriasis, unspecified: Secondary | ICD-10-CM | POA: Diagnosis not present

## 2023-04-14 DIAGNOSIS — G20A1 Parkinson's disease without dyskinesia, without mention of fluctuations: Secondary | ICD-10-CM | POA: Diagnosis not present

## 2023-04-14 DIAGNOSIS — J301 Allergic rhinitis due to pollen: Secondary | ICD-10-CM | POA: Diagnosis not present

## 2023-04-14 DIAGNOSIS — L400 Psoriasis vulgaris: Secondary | ICD-10-CM | POA: Diagnosis not present

## 2023-04-14 DIAGNOSIS — M069 Rheumatoid arthritis, unspecified: Secondary | ICD-10-CM | POA: Diagnosis not present

## 2023-05-02 DIAGNOSIS — L299 Pruritus, unspecified: Secondary | ICD-10-CM | POA: Diagnosis not present

## 2023-05-02 DIAGNOSIS — L400 Psoriasis vulgaris: Secondary | ICD-10-CM | POA: Diagnosis not present

## 2023-05-07 DIAGNOSIS — Z936 Other artificial openings of urinary tract status: Secondary | ICD-10-CM | POA: Diagnosis not present

## 2023-05-10 DIAGNOSIS — Z01818 Encounter for other preprocedural examination: Secondary | ICD-10-CM | POA: Diagnosis not present

## 2023-05-10 DIAGNOSIS — K861 Other chronic pancreatitis: Secondary | ICD-10-CM | POA: Diagnosis not present

## 2023-05-10 DIAGNOSIS — D490 Neoplasm of unspecified behavior of digestive system: Secondary | ICD-10-CM | POA: Diagnosis not present

## 2023-05-10 DIAGNOSIS — R739 Hyperglycemia, unspecified: Secondary | ICD-10-CM | POA: Diagnosis not present

## 2023-05-17 DIAGNOSIS — E785 Hyperlipidemia, unspecified: Secondary | ICD-10-CM | POA: Diagnosis not present

## 2023-05-17 DIAGNOSIS — K219 Gastro-esophageal reflux disease without esophagitis: Secondary | ICD-10-CM | POA: Diagnosis not present

## 2023-05-17 DIAGNOSIS — Z86711 Personal history of pulmonary embolism: Secondary | ICD-10-CM | POA: Diagnosis not present

## 2023-05-17 DIAGNOSIS — R739 Hyperglycemia, unspecified: Secondary | ICD-10-CM | POA: Diagnosis not present

## 2023-05-17 DIAGNOSIS — K861 Other chronic pancreatitis: Secondary | ICD-10-CM | POA: Diagnosis not present

## 2023-05-17 DIAGNOSIS — Z01818 Encounter for other preprocedural examination: Secondary | ICD-10-CM | POA: Diagnosis not present

## 2023-05-17 DIAGNOSIS — D490 Neoplasm of unspecified behavior of digestive system: Secondary | ICD-10-CM | POA: Diagnosis not present

## 2023-05-17 DIAGNOSIS — Z86718 Personal history of other venous thrombosis and embolism: Secondary | ICD-10-CM | POA: Diagnosis not present

## 2023-05-17 DIAGNOSIS — J45909 Unspecified asthma, uncomplicated: Secondary | ICD-10-CM | POA: Diagnosis not present

## 2023-05-17 DIAGNOSIS — Z7901 Long term (current) use of anticoagulants: Secondary | ICD-10-CM | POA: Diagnosis not present

## 2023-05-17 DIAGNOSIS — I10 Essential (primary) hypertension: Secondary | ICD-10-CM | POA: Diagnosis not present

## 2023-05-23 DIAGNOSIS — D509 Iron deficiency anemia, unspecified: Secondary | ICD-10-CM | POA: Diagnosis not present

## 2023-05-25 DIAGNOSIS — C7989 Secondary malignant neoplasm of other specified sites: Secondary | ICD-10-CM | POA: Diagnosis not present

## 2023-05-25 DIAGNOSIS — I10 Essential (primary) hypertension: Secondary | ICD-10-CM | POA: Diagnosis not present

## 2023-05-25 DIAGNOSIS — K862 Cyst of pancreas: Secondary | ICD-10-CM | POA: Diagnosis not present

## 2023-05-25 DIAGNOSIS — D638 Anemia in other chronic diseases classified elsewhere: Secondary | ICD-10-CM | POA: Diagnosis not present

## 2023-05-25 DIAGNOSIS — G20A1 Parkinson's disease without dyskinesia, without mention of fluctuations: Secondary | ICD-10-CM | POA: Diagnosis not present

## 2023-05-25 DIAGNOSIS — J45909 Unspecified asthma, uncomplicated: Secondary | ICD-10-CM | POA: Diagnosis not present

## 2023-05-25 DIAGNOSIS — K861 Other chronic pancreatitis: Secondary | ICD-10-CM | POA: Diagnosis not present

## 2023-05-25 DIAGNOSIS — G8918 Other acute postprocedural pain: Secondary | ICD-10-CM | POA: Diagnosis not present

## 2023-05-25 DIAGNOSIS — D490 Neoplasm of unspecified behavior of digestive system: Secondary | ICD-10-CM | POA: Diagnosis not present

## 2023-05-25 DIAGNOSIS — D136 Benign neoplasm of pancreas: Secondary | ICD-10-CM | POA: Diagnosis not present

## 2023-05-25 DIAGNOSIS — E271 Primary adrenocortical insufficiency: Secondary | ICD-10-CM | POA: Diagnosis not present

## 2023-05-25 DIAGNOSIS — K660 Peritoneal adhesions (postprocedural) (postinfection): Secondary | ICD-10-CM | POA: Diagnosis not present

## 2023-05-25 DIAGNOSIS — C252 Malignant neoplasm of tail of pancreas: Secondary | ICD-10-CM | POA: Diagnosis not present

## 2023-05-25 DIAGNOSIS — E119 Type 2 diabetes mellitus without complications: Secondary | ICD-10-CM | POA: Diagnosis not present

## 2023-05-25 DIAGNOSIS — R918 Other nonspecific abnormal finding of lung field: Secondary | ICD-10-CM | POA: Diagnosis not present

## 2023-05-25 DIAGNOSIS — R079 Chest pain, unspecified: Secondary | ICD-10-CM | POA: Diagnosis not present

## 2023-06-02 DIAGNOSIS — I440 Atrioventricular block, first degree: Secondary | ICD-10-CM | POA: Diagnosis not present

## 2023-06-13 DIAGNOSIS — C252 Malignant neoplasm of tail of pancreas: Secondary | ICD-10-CM | POA: Diagnosis not present

## 2023-06-17 DIAGNOSIS — C252 Malignant neoplasm of tail of pancreas: Secondary | ICD-10-CM | POA: Diagnosis not present

## 2023-07-08 DIAGNOSIS — C252 Malignant neoplasm of tail of pancreas: Secondary | ICD-10-CM | POA: Diagnosis not present

## 2023-07-08 DIAGNOSIS — C678 Malignant neoplasm of overlapping sites of bladder: Secondary | ICD-10-CM | POA: Diagnosis not present

## 2023-07-11 DIAGNOSIS — Z936 Other artificial openings of urinary tract status: Secondary | ICD-10-CM | POA: Diagnosis not present

## 2023-07-27 DIAGNOSIS — C252 Malignant neoplasm of tail of pancreas: Secondary | ICD-10-CM | POA: Diagnosis not present

## 2023-08-08 DIAGNOSIS — L4050 Arthropathic psoriasis, unspecified: Secondary | ICD-10-CM | POA: Diagnosis not present

## 2023-08-08 DIAGNOSIS — M79643 Pain in unspecified hand: Secondary | ICD-10-CM | POA: Diagnosis not present

## 2023-08-08 DIAGNOSIS — L409 Psoriasis, unspecified: Secondary | ICD-10-CM | POA: Diagnosis not present

## 2023-08-08 DIAGNOSIS — M810 Age-related osteoporosis without current pathological fracture: Secondary | ICD-10-CM | POA: Diagnosis not present

## 2023-08-08 DIAGNOSIS — M797 Fibromyalgia: Secondary | ICD-10-CM | POA: Diagnosis not present

## 2023-08-08 DIAGNOSIS — M1990 Unspecified osteoarthritis, unspecified site: Secondary | ICD-10-CM | POA: Diagnosis not present

## 2023-08-08 DIAGNOSIS — M79673 Pain in unspecified foot: Secondary | ICD-10-CM | POA: Diagnosis not present

## 2023-08-08 DIAGNOSIS — K5190 Ulcerative colitis, unspecified, without complications: Secondary | ICD-10-CM | POA: Diagnosis not present

## 2023-08-08 DIAGNOSIS — M2550 Pain in unspecified joint: Secondary | ICD-10-CM | POA: Diagnosis not present

## 2023-08-08 DIAGNOSIS — Z79899 Other long term (current) drug therapy: Secondary | ICD-10-CM | POA: Diagnosis not present

## 2023-08-08 DIAGNOSIS — M7989 Other specified soft tissue disorders: Secondary | ICD-10-CM | POA: Diagnosis not present

## 2023-08-12 DIAGNOSIS — Z936 Other artificial openings of urinary tract status: Secondary | ICD-10-CM | POA: Diagnosis not present

## 2023-08-19 DIAGNOSIS — E119 Type 2 diabetes mellitus without complications: Secondary | ICD-10-CM | POA: Diagnosis not present

## 2023-08-19 DIAGNOSIS — E271 Primary adrenocortical insufficiency: Secondary | ICD-10-CM | POA: Diagnosis not present

## 2023-08-31 DIAGNOSIS — D1803 Hemangioma of intra-abdominal structures: Secondary | ICD-10-CM | POA: Diagnosis not present

## 2023-08-31 DIAGNOSIS — K5730 Diverticulosis of large intestine without perforation or abscess without bleeding: Secondary | ICD-10-CM | POA: Diagnosis not present

## 2023-08-31 DIAGNOSIS — I700 Atherosclerosis of aorta: Secondary | ICD-10-CM | POA: Diagnosis not present

## 2023-08-31 DIAGNOSIS — Z8551 Personal history of malignant neoplasm of bladder: Secondary | ICD-10-CM | POA: Diagnosis not present

## 2023-08-31 DIAGNOSIS — K862 Cyst of pancreas: Secondary | ICD-10-CM | POA: Diagnosis not present

## 2023-09-08 ENCOUNTER — Ambulatory Visit (INDEPENDENT_AMBULATORY_CARE_PROVIDER_SITE_OTHER): Payer: 59 | Admitting: Urology

## 2023-09-08 ENCOUNTER — Encounter: Payer: Self-pay | Admitting: Urology

## 2023-09-08 VITALS — BP 113/71 | HR 67 | Ht 64.0 in | Wt 145.0 lb

## 2023-09-08 DIAGNOSIS — C678 Malignant neoplasm of overlapping sites of bladder: Secondary | ICD-10-CM | POA: Diagnosis not present

## 2023-09-08 LAB — URINALYSIS, ROUTINE W REFLEX MICROSCOPIC
Bilirubin, UA: NEGATIVE
Glucose, UA: NEGATIVE
Leukocytes,UA: NEGATIVE
Nitrite, UA: NEGATIVE
Specific Gravity, UA: 1.025 (ref 1.005–1.030)
Urobilinogen, Ur: 0.2 mg/dL (ref 0.2–1.0)
pH, UA: 6.5 (ref 5.0–7.5)

## 2023-09-08 LAB — MICROSCOPIC EXAMINATION

## 2023-09-08 NOTE — Progress Notes (Signed)
Assessment: 1. Malignant neoplasm of overlapping sites of bladder [facility]); status post cystectomy with ileal conduit 11/21     Plan: I reviewed records from [facility] including the recent CT study from 09/07/2023 and recent lab work. She is doing very well well at almost 3 years status post cystectomy with ileal conduit.  She has not had any evidence of recurrence to date. Recommend follow-up in 6 months. She will continue to follow-up with Dr. Ottis Stain.  I personally spent 30 minutes involved in face to face and non-face-to-face activities for this patient on the day of the visit.  Professional time spent included the following activities, in addition to those noted in the documentation: Review of labs and imaging results, discussion of results with the patient, discussion of recommendations for continued follow-up.  Chief Complaint:  Chief Complaint  Patient presents with   Bladder Cancer    History of Present Illness:  Lauren Graves is a 62 y.o. year old female who is seen for continued evaluation of bladder cancer.  She was previously followed at [facility] with her last visit there in February 2022. She was last seen here in April 2023.  At that time she felt well.  She had a good appetite with no recent weight loss.  She had been treated for a UTI due to foul-smelling urine and back pain.   Urine culture from 02/09/2022 grew >100 K Morganella and >100 K Providencia. Her vaginal discharge was stable.  She reported occasional discomfort around her stoma.  No problems with her appliance.    She was hospitalized in September 2023 and again in October 23.  She was diagnosed with Addison's disease and subsequently with a exacerbation of her ulcerative colitis.  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  She was restarted on anticoagulation with Eliquis.  She reported that her appetite was slightly decreased.  Her weight had been stable.  She had good  output from her ostomy.  No gross hematuria.  No flank pain.  No problem with her ostomy appliance.  She was having normal bowel movements.  She continued to have vaginal discharge which was unchanged. CT imaging from 07/06/2022 showed no evidence of metastatic disease in the chest, abdomen, or pelvis. CT abdomen and pelvis from 08/24/2022 showed evidence of colitis, no evidence of metastatic disease or adenopathy. CMP from 08/23/2022 showed a creatinine of 0.89, elevated alkaline phosphatase, normal AST and ALT. She was last seen by Dr. Ottis Stain on 02/22/2023.  CT of the chest abdomen and pelvis showed no evidence of recurrent or metastatic bladder cancer.  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 CMP showed a creatinine of 0.89, elevated alkaline phosphatase of 129. 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  She has recovered well from this procedure. CT chest abdomen and pelvis from 09/07/2023 showed no evidence of recurrent or metastatic disease within the chest, abdomen, or pelvis. CMP from 08/20/2023 showed a creatinine of 0.79 and normal LFTs.  She returns today for follow-up.  She is doing very well following her surgery in July.  She reports a good appetite and stable weight.  No flank pain or gross hematuria.  She has had good urine output from her ostomy.  She continues with some intermittent vaginal drainage.  She continues to use vaginal hormone  cream.  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 She received MVAC chemotherapy. She has a history of a pulmonary embolism  managed with an IVC filter as well as anticoagulation. She is status post an anterior pelvic exenteration with ileal conduit urinary diversion in November 2021. Pathology showed carcinoma in situ involving  the bladder, no residual invasive urothelial carcinoma identified, negative ureteral margins, negative lymph nodes. She has done well postoperatively. Her ureteral stents were removed on 10/20/2020. CMP from 10/23/2020 showed a normal creatinine of 0.76 and normal LFTs. Renal ultrasound from 11/13/2020 showed no evidence of hydronephrosis.  She has been followed by Dr. Ottis Stain in oncology.    CT of the chest, abdomen, and pelvis from 02/07/2022 showed no evidence of recurrent or metastatic disease in the chest, abdomen, or pelvis.     She has had problems with vaginal discharge and has been evaluated by urogynecology.  She was started on vaginal estrogen cream, vaginal flushes and sitz bath's.  At her visit in March 2023, she had noted improvement in her symptoms.   Portions of the above documentation were copied from a prior visit for review purposes only.   Past Medical History:  Past Medical History:  Diagnosis Date   Bladder cancer ([facility])    Diabetes ([facility])    DJD (degenerative joint disease), lumbosacral    DVT (deep venous thrombosis) ([facility])    Hypercholesterolemia    Hypertension    Pulmonary emboli ([facility])    Ulcerative colitis ([facility])    Vaginal prolapse     Past Surgical History:  Past Surgical History:  Procedure Laterality Date   BREAST BIOPSY     BREAST LUMPECTOMY     CHOLECYSTECTOMY     CYSTECTOMY W/ URETEROILEAL CONDUIT     IR RADIOLOGIST EVAL & MGMT  09/02/2020   IR RADIOLOGIST EVAL & MGMT  12/03/2020   TRANSURETHRAL RESECTION OF BLADDER TUMOR WITH GYRUS (TURBT-GYRUS)      Allergies:  Allergies  Allergen Reactions   Sulfa Antibiotics Hives, Rash, Shortness Of Breath and Swelling   Tape Other (See Comments)    Takes skin off     Family History:  No family history on file.  Social History:  Social History   Tobacco Use   Smoking status: Never   Smokeless tobacco: Never  Substance Use Topics   Alcohol use: Never    ROS: Constitutional:  Negative for fever,  chills, weight loss CV: Negative for chest pain, previous MI, hypertension Respiratory:  Negative for shortness of breath, wheezing, sleep apnea, frequent cough GI:  Negative for nausea, vomiting, bloody stool, GERD   Physical exam: BP 113/71   Pulse 67   Ht 5\' 4"  (1.626 m)   Wt 145 lb (65.8 kg)   BMI 24.89 kg/m  GENERAL APPEARANCE:  Well appearing, well developed, well nourished, NAD HEENT:  Atraumatic, normocephalic, oropharynx clear NECK:  Supple without lymphadenopathy or thyromegaly ABDOMEN:  Soft, non-tender, no masses; stoma pink and healthy EXTREMITIES:  Moves all extremities well, without clubbing, cyanosis, or edema NEUROLOGIC:  Alert and oriented x 3, normal gait, CN II-XII grossly intact MENTAL STATUS:  appropriate BACK:  Non-tender to palpation, No CVAT SKIN:  Warm, dry, and intact  Results: U/A: 3-10 RBC, 0-5 WBC

## 2023-09-12 DIAGNOSIS — Z936 Other artificial openings of urinary tract status: Secondary | ICD-10-CM | POA: Diagnosis not present

## 2023-09-12 DIAGNOSIS — C252 Malignant neoplasm of tail of pancreas: Secondary | ICD-10-CM | POA: Diagnosis not present

## 2023-09-12 DIAGNOSIS — Z08 Encounter for follow-up examination after completed treatment for malignant neoplasm: Secondary | ICD-10-CM | POA: Diagnosis not present

## 2023-09-12 DIAGNOSIS — D649 Anemia, unspecified: Secondary | ICD-10-CM | POA: Diagnosis not present

## 2023-09-12 DIAGNOSIS — C678 Malignant neoplasm of overlapping sites of bladder: Secondary | ICD-10-CM | POA: Diagnosis not present

## 2023-09-13 DIAGNOSIS — C252 Malignant neoplasm of tail of pancreas: Secondary | ICD-10-CM | POA: Diagnosis not present

## 2023-09-26 DIAGNOSIS — M810 Age-related osteoporosis without current pathological fracture: Secondary | ICD-10-CM | POA: Diagnosis not present

## 2023-10-11 DIAGNOSIS — Z936 Other artificial openings of urinary tract status: Secondary | ICD-10-CM | POA: Diagnosis not present

## 2023-10-26 DIAGNOSIS — E7800 Pure hypercholesterolemia, unspecified: Secondary | ICD-10-CM | POA: Diagnosis not present

## 2023-10-26 DIAGNOSIS — Z79899 Other long term (current) drug therapy: Secondary | ICD-10-CM | POA: Diagnosis not present

## 2023-10-26 DIAGNOSIS — G894 Chronic pain syndrome: Secondary | ICD-10-CM | POA: Diagnosis not present

## 2023-10-26 DIAGNOSIS — F410 Panic disorder [episodic paroxysmal anxiety] without agoraphobia: Secondary | ICD-10-CM | POA: Diagnosis not present

## 2023-11-08 DIAGNOSIS — Z936 Other artificial openings of urinary tract status: Secondary | ICD-10-CM | POA: Diagnosis not present

## 2024-03-08 ENCOUNTER — Ambulatory Visit: Payer: 59 | Admitting: Urology

## 2024-03-13 ENCOUNTER — Ambulatory Visit: Payer: 59 | Admitting: Urology

## 2024-04-19 ENCOUNTER — Ambulatory Visit: Admitting: Urology

## 2024-04-26 ENCOUNTER — Ambulatory Visit: Admitting: Urology

## 2024-05-19 ENCOUNTER — Encounter: Payer: Self-pay | Admitting: Urology

## 2024-05-30 ENCOUNTER — Ambulatory Visit (INDEPENDENT_AMBULATORY_CARE_PROVIDER_SITE_OTHER): Admitting: Urology

## 2024-05-30 ENCOUNTER — Encounter: Payer: Self-pay | Admitting: Urology

## 2024-05-30 VITALS — BP 93/60 | HR 80 | Ht 64.0 in | Wt 144.0 lb

## 2024-05-30 DIAGNOSIS — Z08 Encounter for follow-up examination after completed treatment for malignant neoplasm: Secondary | ICD-10-CM

## 2024-05-30 DIAGNOSIS — Z8551 Personal history of malignant neoplasm of bladder: Secondary | ICD-10-CM | POA: Diagnosis not present

## 2024-05-30 DIAGNOSIS — C678 Malignant neoplasm of overlapping sites of bladder: Secondary | ICD-10-CM

## 2024-05-30 LAB — MICROSCOPIC EXAMINATION

## 2024-05-30 LAB — URINALYSIS, ROUTINE W REFLEX MICROSCOPIC
Bilirubin, UA: NEGATIVE
Glucose, UA: NEGATIVE
Leukocytes,UA: NEGATIVE
Nitrite, UA: NEGATIVE
Specific Gravity, UA: 1.015 (ref 1.005–1.030)
Urobilinogen, Ur: 0.2 mg/dL (ref 0.2–1.0)
pH, UA: 7.5 (ref 5.0–7.5)

## 2024-05-30 NOTE — Progress Notes (Signed)
 Assessment: 1. Malignant neoplasm of overlapping sites of bladder [facility]); status post cystectomy with ileal conduit 11/21     Plan: No evidence of recurrence at 3-1/2 years status post cystectomy with ileal conduit.   Recommend follow-up in 6 months. She will continue to follow-up with Dr. Madison.   Chief Complaint:  Chief Complaint  Patient presents with   Bladder Cancer    History of Present Illness:  Lauren Graves is a 63 y.o. year old female who is seen for continued evaluation of bladder cancer.  She was previously followed at [facility] with her last visit there in February 2022. She was seen here in April 2023.  At that time she felt well.  She had a good appetite with no recent weight loss.  She had been treated for a UTI due to foul-smelling urine and back pain.   Urine culture from 02/09/2022 grew >100 K Morganella and >100 K Providencia. Her vaginal discharge was stable.  She reported occasional discomfort around her stoma.  No problems with her appliance.    She was hospitalized in September 2023 and again in October 23.  She was diagnosed with Addison's disease and subsequently with a exacerbation of her ulcerative colitis.  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  She was restarted on anticoagulation with Eliquis.  She reported that her appetite was slightly decreased.  Her weight had been stable.  She had good output from her ostomy.  No gross hematuria.  No flank pain.  No problem with her ostomy appliance.  She was having normal bowel movements.  She continued to have vaginal discharge which was unchanged. CT imaging from 07/06/2022 showed no evidence of metastatic disease in the chest, abdomen, or pelvis. CT abdomen and pelvis from 08/24/2022 showed evidence of colitis, no evidence of metastatic disease or adenopathy. CMP from 08/23/2022 showed a creatinine of 0.89, elevated alkaline phosphatase, normal AST and ALT. She was last seen  by Dr. Madison on 02/22/2023.  CT of the chest abdomen and pelvis showed no evidence of recurrent or metastatic bladder cancer.  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 CMP showed a creatinine of 0.89, elevated alkaline phosphatase of 129. 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  She has recovered well from this procedure. CT chest abdomen and pelvis from 09/07/2023 showed no evidence of recurrent or metastatic disease within the chest, abdomen, or pelvis. CMP from 08/20/2023 showed a creatinine of 0.79 and normal LFTs. B12 from 10/24:  279  At her visit in October 2024, she was doing well.  She reported a good appetite and stable weight.  No flank pain or gross hematuria.  She had good urine output from her ostomy.  She continued with some intermittent vaginal drainage.  She continued to use vaginal hormone cream.  CT chest abdomen and pelvis from 4/25 showed no evidence of metastatic disease in the abdomen pelvis, or chest, no evidence of hydronephrosis or filling defect. CMP from 4/25 showed a creatinine of 0.75 and normal LFTs.  She returns today for follow-up.  She is overall doing well from her urologic standpoint.  Her urine output has been good.  No problems with her stoma.  She does notice some odor to her urine.  This improves with increased fluid intake.  No flank pain  or gross hematuria.  She continues to have vaginal discharge.  She has discontinued the Estrace vaginal cream as she did not feel like this was helping.  She does have generalized fatigue and lack of energy.  She also reports discomfort throughout her entire body.  She has noted itching and pain in her upper abdominal incision following her pancreatectomy last year.  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 She received MVAC chemotherapy. She has a history of a pulmonary embolism  managed with an IVC filter as well as anticoagulation. She is status post an anterior pelvic exenteration with ileal conduit urinary diversion in November 2021. 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 She has done well postoperatively. Her ureteral stents were removed on 10/20/2020. CMP from 10/23/2020 showed a normal creatinine of 0.76 and normal LFTs. Renal ultrasound from 11/13/2020 showed no evidence of hydronephrosis.  She has been followed by Dr. Madison in oncology.    CT of the chest, abdomen, and pelvis from 02/07/2022 showed no evidence of recurrent or metastatic disease in the chest, abdomen, or pelvis.     She has had problems with vaginal discharge and has been evaluated by urogynecology.  She was started on vaginal estrogen cream, vaginal flushes and sitz bath's.  At her visit in March 2023, she had noted improvement in her symptoms.   Portions of the above documentation were copied from a prior visit for review purposes only.   Past Medical History:  Past Medical History:  Diagnosis Date   Bladder cancer (HCC)    Diabetes (HCC)    DJD (degenerative joint disease), lumbosacral    DVT (deep venous thrombosis) (HCC)    Hypercholesterolemia    Hypertension    Pulmonary emboli (HCC)    Ulcerative colitis (HCC)    Vaginal prolapse     Past Surgical History:  Past Surgical History:  Procedure Laterality Date   BREAST BIOPSY     BREAST LUMPECTOMY     CHOLECYSTECTOMY     CYSTECTOMY W/ URETEROILEAL CONDUIT     IR RADIOLOGIST EVAL & MGMT  09/02/2020   IR RADIOLOGIST EVAL & MGMT  12/03/2020   TRANSURETHRAL RESECTION OF BLADDER TUMOR WITH GYRUS (TURBT-GYRUS)      Allergies:  Allergies  Allergen Reactions   Sulfa Antibiotics Hives, Rash, Shortness Of Breath and Swelling   Tape  Other (See Comments)    Takes skin off     Family History:  No family history on file.  Social History:  Social History   Tobacco Use   Smoking status: Never   Smokeless tobacco: Never  Substance Use Topics   Alcohol use: Never    ROS: Constitutional:  Negative for fever, chills, weight loss CV: Negative for chest pain, previous MI, hypertension Respiratory:  Negative for shortness of breath, wheezing, sleep apnea, frequent cough GI:  Negative for nausea, vomiting, bloody stool, GERD   Physical exam: BP 93/60   Pulse 80   Ht 5' 4 (1.626 m)   Wt 144 lb (65.3 kg)   BMI 24.72 kg/m  GENERAL APPEARANCE:  Well appearing, well developed, well nourished, NAD HEENT:  Atraumatic, normocephalic, oropharynx clear NECK:  Supple without lymphadenopathy or thyromegaly ABDOMEN:  Soft, non-tender, no masses; upper abdominal incision is well-healed, no erythema; stoma pink, healthy EXTREMITIES:  Moves all extremities well, without clubbing, cyanosis, or edema NEUROLOGIC:  Alert and oriented  x 3, normal gait, CN II-XII grossly intact MENTAL STATUS:  appropriate BACK:  Non-tender to palpation, No CVAT SKIN:  Warm, dry, and intact  Results: U/A: 0-5 WBCs, 3-10 RBCs, few bacteria

## 2024-12-03 ENCOUNTER — Ambulatory Visit: Admitting: Urology

## 2024-12-03 NOTE — Progress Notes (Unsigned)
 "  Assessment: 1. Malignant neoplasm of overlapping sites of bladder [facility]); status post cystectomy with ileal conduit 11/21     Plan: No evidence of recurrence at 4 years status post cystectomy with ileal conduit.   Recommend follow-up in 6 months. She will continue to follow-up with Dr. Madison.   Chief Complaint:  No chief complaint on file.   History of Present Illness:  Lauren Graves is a 64 y.o. year old female who is seen for continued evaluation of bladder cancer.  She was previously followed at [facility] with her last visit there in February 2022. She was seen here in April 2023.  At that time she felt well.  She had a good appetite with no recent weight loss.  She had been treated for a UTI due to foul-smelling urine and back pain.   Urine culture from 02/09/2022 grew >100 K Morganella and >100 K Providencia. Her vaginal discharge was stable.  She reported occasional discomfort around her stoma.  No problems with her appliance.    She was hospitalized in September 2023 and again in October 23.  She was diagnosed with Addison's disease and subsequently with a exacerbation of her ulcerative colitis.  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  She was restarted on anticoagulation with Eliquis.  She reported that her appetite was slightly decreased.  Her weight had been stable.  She had good output from her ostomy.  No gross hematuria.  No flank pain.  No problem with her ostomy appliance.  She was having normal bowel movements.  She continued to have vaginal discharge which was unchanged. CT imaging from 07/06/2022 showed no evidence of metastatic disease in the chest, abdomen, or pelvis. CT abdomen and pelvis from 08/24/2022 showed evidence of colitis, no evidence of metastatic disease or adenopathy. CMP from 08/23/2022 showed a creatinine of 0.89, elevated alkaline phosphatase, normal AST and ALT. She was last seen by Dr. Madison on 02/22/2023.  CT of the  chest abdomen and pelvis showed no evidence of recurrent or metastatic bladder cancer.  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 CMP showed a creatinine of 0.89, elevated alkaline phosphatase of 129. 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  She has recovered well from this procedure. CT chest abdomen and pelvis from 09/07/2023 showed no evidence of recurrent or metastatic disease within the chest, abdomen, or pelvis. CMP from 08/20/2023 showed a creatinine of 0.79 and normal LFTs. B12 from 10/24:  279  At her visit in October 2024, she was doing well.  She reported a good appetite and stable weight.  No flank pain or gross hematuria.  She had good urine output from her ostomy.  She continued with some intermittent vaginal drainage.  She continued to use vaginal hormone cream.  CT chest abdomen and pelvis from 4/25 showed no evidence of metastatic disease in the abdomen pelvis, or chest, no evidence of hydronephrosis or filling defect. CMP from 4/25 showed a creatinine of 0.75 and normal LFTs.  At her visit in July 2025, she was doing well from her urologic standpoint.  Her urine output had been good.  No problems with her stoma.  She notices some odor to her urine which improves with increased fluid intake.  No flank pain or gross hematuria.  She continued to have  vaginal discharge.  She had discontinued the Estrace vaginal cream as she did not feel like this was helping.  She had generalized fatigue and lack of energy.  She also reported discomfort throughout her entire body.  She noted itching and pain in her upper abdominal incision following her pancreatectomy last year.  She was seen by Dr. Madison in early January 2026. CT chest, abdomen, and pelvis from 11/16/2024 showed no evidence of metastatic disease, no evidence of  urinary obstruction.  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 She received MVAC chemotherapy. She has a history of a pulmonary embolism  managed with an IVC filter as well as anticoagulation. She is status post an anterior pelvic exenteration with ileal conduit urinary diversion in November 2021. 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 She has done well postoperatively. Her ureteral stents were removed on 10/20/2020. CMP from 10/23/2020 showed a normal creatinine of 0.76 and normal LFTs. Renal ultrasound from 11/13/2020 showed no evidence of hydronephrosis.  She has been followed by Dr. Madison in oncology.    CT of the chest, abdomen, and pelvis from 02/07/2022 showed no evidence of recurrent or metastatic disease in the chest, abdomen, or pelvis.     She has had problems with vaginal discharge and has been evaluated by urogynecology.  She was started on vaginal estrogen cream, vaginal flushes and sitz bath's.  At her visit in March 2023, she had noted improvement in her symptoms.   Portions of the above documentation were copied from a prior visit for review purposes only.   Past Medical History:  Past Medical History:  Diagnosis Date   Bladder cancer (HCC)    Diabetes (HCC)    DJD (degenerative joint disease), lumbosacral    DVT (deep venous thrombosis) (HCC)    Hypercholesterolemia    Hypertension    Pulmonary emboli (HCC)    Ulcerative colitis (HCC)    Vaginal prolapse     Past Surgical History:  Past Surgical History:  Procedure Laterality Date   BREAST BIOPSY     BREAST LUMPECTOMY     CHOLECYSTECTOMY     CYSTECTOMY W/ URETEROILEAL CONDUIT     IR RADIOLOGIST EVAL & MGMT  09/02/2020   IR RADIOLOGIST EVAL & MGMT  12/03/2020   TRANSURETHRAL RESECTION OF BLADDER TUMOR WITH GYRUS (TURBT-GYRUS)       Allergies:  Allergies  Allergen Reactions   Sulfa Antibiotics Hives, Rash, Shortness Of Breath and Swelling   Tape Other (See Comments)    Takes skin off     Family History:  No family history on file.  Social History:  Social History   Tobacco Use   Smoking status: Never   Smokeless tobacco: Never  Substance Use Topics   Alcohol use: Never    ROS: Constitutional:  Negative for fever, chills, weight loss CV: Negative for chest pain, previous MI, hypertension Respiratory:  Negative for shortness of breath, wheezing, sleep apnea, frequent cough GI:  Negative for nausea, vomiting, bloody stool, GERD   Physical exam: There were no vitals taken for this visit. GENERAL APPEARANCE:  Well appearing, well developed, well nourished, NAD HEENT:  Atraumatic, normocephalic, oropharynx clear NECK:  Supple without lymphadenopathy or thyromegaly ABDOMEN:  Soft, non-tender, no masses EXTREMITIES:  Moves all extremities well, without clubbing, cyanosis, or edema NEUROLOGIC:  Alert and oriented x 3, normal gait, CN II-XII grossly intact MENTAL  STATUS:  appropriate BACK:  Non-tender to palpation, No CVAT SKIN:  Warm, dry, and intact  Results: U/A:  "
